# Patient Record
Sex: Male | Born: 1975 | Race: Black or African American | Hispanic: No | Marital: Single | Smoking: Never smoker
Health system: Southern US, Community
[De-identification: ages and names within clinical notes are randomized; demographics above are authoritative.]

---

## 1998-02-16 ENCOUNTER — Emergency Department (HOSPITAL_COMMUNITY): Admission: EM | Admit: 1998-02-16 | Discharge: 1998-02-16 | Payer: Self-pay | Admitting: Emergency Medicine

## 2008-10-26 ENCOUNTER — Encounter: Payer: Self-pay | Admitting: Licensed Clinical Social Worker

## 2008-10-26 ENCOUNTER — Ambulatory Visit: Payer: Self-pay | Admitting: Infectious Disease

## 2008-10-26 DIAGNOSIS — F341 Dysthymic disorder: Secondary | ICD-10-CM | POA: Insufficient documentation

## 2008-10-26 DIAGNOSIS — T7840XA Allergy, unspecified, initial encounter: Secondary | ICD-10-CM | POA: Insufficient documentation

## 2008-10-26 DIAGNOSIS — J45909 Unspecified asthma, uncomplicated: Secondary | ICD-10-CM | POA: Insufficient documentation

## 2008-10-26 DIAGNOSIS — B20 Human immunodeficiency virus [HIV] disease: Secondary | ICD-10-CM | POA: Insufficient documentation

## 2008-10-26 LAB — CONVERTED CEMR LAB
Albumin: 4.1 g/dL (ref 3.5–5.2)
BUN: 18 mg/dL (ref 6–23)
Basophils Absolute: 0.1 10*3/uL (ref 0.0–0.1)
CO2: 24 meq/L (ref 19–32)
Calcium: 9.3 mg/dL (ref 8.4–10.5)
Chlamydia, Swab/Urine, PCR: NEGATIVE
Cholesterol: 130 mg/dL (ref 0–200)
Eosinophils Relative: 3 % (ref 0–5)
GC Probe Amp, Urine: NEGATIVE
GFR calc Af Amer: >60 mL/min (ref >60)
GFR calc non Af Amer: 55 mL/min — ABNORMAL LOW (ref >60)
HDL: 24 mg/dL — ABNORMAL LOW (ref >39)
HIV-1 antibody: POSITIVE — AB
HIV-2 Ab: UNDETERMINED — AB
HIV: REACTIVE
Hemoglobin, Urine: NEGATIVE
Hep A Total Ab: NEGATIVE
LDL Cholesterol: 51 mg/dL (ref 0–99)
Leukocytes, UA: NEGATIVE
Lymphocytes Relative: 49 % — ABNORMAL HIGH (ref 12–46)
Lymphs Abs: 2.4 10*3/uL (ref 0.7–4.0)
MCHC: 34 g/dL (ref 30.0–36.0)
Monocytes Relative: 14 % — ABNORMAL HIGH (ref 3–12)
Nitrite: NEGATIVE
Protein, ur: NEGATIVE mg/dL
Sodium: 139 meq/L (ref 135–145)
Total CHOL/HDL Ratio: 5.4
Urine Glucose: NEGATIVE mg/dL
Urobilinogen, UA: 0.2 (ref 0.0–1.0)
WBC: 4.9 10*3/uL (ref 4.0–10.5)
pH: 6.5 (ref 5.0–8.0)

## 2008-10-30 ENCOUNTER — Telehealth: Payer: Self-pay

## 2008-10-30 ENCOUNTER — Encounter: Payer: Self-pay | Admitting: Infectious Disease

## 2008-11-01 ENCOUNTER — Encounter: Payer: Self-pay | Admitting: Infectious Disease

## 2008-11-13 ENCOUNTER — Ambulatory Visit: Payer: Self-pay | Admitting: Infectious Disease

## 2008-11-13 DIAGNOSIS — E785 Hyperlipidemia, unspecified: Secondary | ICD-10-CM | POA: Insufficient documentation

## 2008-11-13 LAB — CONVERTED CEMR LAB: HIV 1 RNA Quant: 801 copies/mL — ABNORMAL HIGH (ref <48)

## 2008-11-16 ENCOUNTER — Telehealth: Payer: Self-pay | Admitting: Infectious Disease

## 2008-12-11 ENCOUNTER — Ambulatory Visit: Payer: Self-pay | Admitting: Infectious Disease

## 2008-12-11 DIAGNOSIS — R5383 Other fatigue: Secondary | ICD-10-CM | POA: Insufficient documentation

## 2008-12-11 DIAGNOSIS — F0630 Mood disorder due to known physiological condition, unspecified: Secondary | ICD-10-CM | POA: Insufficient documentation

## 2008-12-11 DIAGNOSIS — R5381 Other malaise: Secondary | ICD-10-CM | POA: Insufficient documentation

## 2008-12-11 LAB — CONVERTED CEMR LAB: TSH: 1.872 microintl units/mL (ref 0.350–4.500)

## 2009-11-13 ENCOUNTER — Ambulatory Visit: Payer: Self-pay | Admitting: Infectious Disease

## 2009-11-15 ENCOUNTER — Encounter: Payer: Self-pay | Admitting: Infectious Disease

## 2009-11-26 ENCOUNTER — Ambulatory Visit: Payer: Self-pay | Admitting: Infectious Disease

## 2009-11-26 DIAGNOSIS — N189 Chronic kidney disease, unspecified: Secondary | ICD-10-CM | POA: Insufficient documentation

## 2009-11-26 DIAGNOSIS — R059 Cough, unspecified: Secondary | ICD-10-CM | POA: Insufficient documentation

## 2009-11-26 DIAGNOSIS — R05 Cough: Secondary | ICD-10-CM

## 2009-11-26 DIAGNOSIS — J050 Acute obstructive laryngitis [croup]: Secondary | ICD-10-CM | POA: Insufficient documentation

## 2009-11-26 LAB — CONVERTED CEMR LAB
Basophils Absolute: 0 10*3/uL (ref 0.0–0.1)
Basophils Relative: 1 % (ref 0–1)
Eosinophils Relative: 2 % (ref 0–5)
HCT: 47.4 % (ref 39.0–52.0)
HCV Ab: NEGATIVE
Hemoglobin: 15.8 g/dL (ref 13.0–17.0)
LDH: 147 units/L (ref 94–250)
Monocytes Relative: 9 % (ref 3–12)
Neutro Abs: 1.1 10*3/uL — ABNORMAL LOW (ref 1.7–7.7)
Nitrite: NEGATIVE
RBC: 5.35 M/uL (ref 4.22–5.81)
RDW: 13.3 % (ref 11.5–15.5)
Urobilinogen, UA: 0.2 (ref 0.0–1.0)

## 2009-12-06 ENCOUNTER — Encounter: Payer: Self-pay | Admitting: Infectious Disease

## 2009-12-10 ENCOUNTER — Ambulatory Visit: Payer: Self-pay | Admitting: Infectious Disease

## 2009-12-10 ENCOUNTER — Ambulatory Visit (HOSPITAL_COMMUNITY): Admission: RE | Admit: 2009-12-10 | Discharge: 2009-12-10 | Payer: Self-pay | Admitting: Infectious Disease

## 2010-04-30 ENCOUNTER — Telehealth: Payer: Self-pay

## 2010-05-01 ENCOUNTER — Telehealth: Payer: Self-pay

## 2010-05-06 ENCOUNTER — Encounter: Payer: Self-pay | Admitting: Infectious Disease

## 2010-05-07 ENCOUNTER — Encounter: Payer: Self-pay | Admitting: Infectious Disease

## 2010-08-11 ENCOUNTER — Encounter: Payer: Self-pay | Admitting: Infectious Disease

## 2010-08-18 LAB — CONVERTED CEMR LAB
ALT: 18 units/L (ref 0–53)
BUN: 19 mg/dL (ref 6–23)
Calcium: 9.5 mg/dL (ref 8.4–10.5)
Creatinine, Ser: 1.53 mg/dL — ABNORMAL HIGH (ref 0.40–1.50)
Eosinophils Absolute: 0.1 10*3/uL (ref 0.0–0.7)
GC Probe Amp, Urine: NEGATIVE
HCT: 44.8 % (ref 39.0–52.0)
Hemoglobin, Urine: NEGATIVE
Hemoglobin: 15.5 g/dL (ref 13.0–17.0)
Ketones, ur: NEGATIVE mg/dL
Lymphocytes Relative: 48 % — ABNORMAL HIGH (ref 12–46)
Monocytes Relative: 12 % (ref 3–12)
Neutro Abs: 1.5 10*3/uL — ABNORMAL LOW (ref 1.7–7.7)
Neutrophils Relative %: 38 % — ABNORMAL LOW (ref 43–77)
Platelets: 310 10*3/uL (ref 150–400)
Potassium: 4.4 meq/L (ref 3.5–5.3)
Protein, ur: NEGATIVE mg/dL
RBC: 5.13 M/uL (ref 4.22–5.81)
RDW: 13.2 % (ref 11.5–15.5)
Sodium: 136 meq/L (ref 135–145)
Specific Gravity, Urine: 1.029 (ref 1.005–1.030)
Total Bilirubin: 0.5 mg/dL (ref 0.3–1.2)
Urine Glucose: NEGATIVE mg/dL
Urobilinogen, UA: 0.2 (ref 0.0–1.0)

## 2010-08-20 NOTE — Progress Notes (Signed)
Summary: Having surgery .  Phone Note Call from Patient   Summary of Call: Pt called he is having his tonsils removed and wonders if he should inform the ENT physician that he is HIV positive. Pt informed he must inform the physician and should have done this upon initiation   of treatment. If labs or records are needed they can contact our office.  Tomasita Morrow RN  May 01, 2010 11:20 AM  Initial call taken by: Tomasita Morrow RN,  May 01, 2010 11:20 AM

## 2010-08-20 NOTE — Letter (Signed)
Summary: F[employer]A: [employer]  FMLA: [employer]   Imported By: Florinda Marker 11/29/2009 11:18:43  _____________________________________________________________________  External Attachment:    Type:   Image     Comment:   External Document

## 2010-08-20 NOTE — Medication Information (Signed)
Summary: [employer]:RX  [employer]:RX   Imported By: Florinda Marker 05/14/2010 11:46:22  _____________________________________________________________________  External Attachment:    Type:   Image     Comment:   External Document

## 2010-08-20 NOTE — Assessment & Plan Note (Signed)
Summary: fukam   Visit Type:  Follow-up Primary Provider:  [name]  CC:  f/u congestion.  History of Present Illness: 35 year old was tested for HIV positive in January 2010.His cd4 cound in the 300s, and low viral load. He  has failed to followup with me for more than a year and his cd4 is now down to 290. I spent more than an hour with this patient with more than 30 minutes spent  face to face counselling him. Mr. Lezotte has been suffering from severe depression made more complicated by the loss of his grandmother to whom he was especially close. He has just met a new potential lover and is having great difficulty revealign to him him that he is HIV positive. He denies even having kissed this partner. I emphasized the need for him to disclose his status and to seek that of this potential friend. He continues to work but is severely depressed stil though he denies passive or active suicidal ideation. He was offered psychaitry counselling and social work referral. He does feel that depression has improved since starting the prozac. He statest taht he felt even better with provigil but I told him I was quite reluctant to prescribet that contorlled substance for him without psychiatry evluating him and their feeling certain that he needed it. Second the patient endorsed recent sinus congestion and nonprodcutive cough that has concerned him for possible bronchitis. We spent more than 30 minutes specifically discussing antiviral therapy choices for him within the context of his elevated creatinine at 1.53 on recent lab tests including possibility of epzicom plus sustive vs PI vs raltegriavr vs combivir plus sustiva vs PI vs raltegravir vs less conventional choice.  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 Take 1 Tablet By Mouth Once A Day  Current Medications (verified): 1)  Singulair 10 Mg Tabs (Montelukast Sodium) 2)  Fluoxetine Hcl 20 Mg Tabs (Fluoxetine Hcl) .... Take 1 Tablet By Mouth Once A Day 3)  Zithromax Z-Pak 250 Mg Tabs (Azithromycin) .... Take As Directed 4)  Proventil Hfa 108 (90 Base) Mcg/act Aers (Albuterol Sulfate) .... Two Puffs Four Times A Day  Allergies (verified): No Known Drug Allergies    Preventive Screening-Counseling & Management  Alcohol-Tobacco     Alcohol drinks/day: 0     Smoking Status: never  Caffeine-Diet-Exercise     Caffeine use/day: o     Does Patient Exercise: yes     Type of exercise: aerobic     Exercise (avg: min/session): 1 hour      Times/week: 3   Current Allergies (reviewed today): No known allergies  Past History:  Past Medical History: Last updated: 11/13/2008 Ashtma Anxiety Depression HIV diagnosed in [place] of 2010  Family History: Last updated: 11/13/2008 early coronary artery disease, esrd  Social History: Last updated: 11/13/2008 no smoking, occ etoh  Risk Factors: Alcohol Use: 0 (11/26/2009) Caffeine Use: o (11/26/2009) Exercise: yes (11/26/2009)  Risk Factors: Smoking Status: never (11/26/2009)  Family  History: Reviewed history from 11/13/2008 and no changes required. early coronary artery disease, esrd  Social History: Reviewed history from 11/13/2008 and no changes required. no smoking, occ etoh  Review of Systems       The patient complains of prolonged cough and depression.  The patient denies anorexia, fever, weight loss, weight gain, vision loss, decreased hearing,  hoarseness, chest pain, syncope, dyspnea on exertion, peripheral edema, headaches, hemoptysis, abdominal pain, melena, hematochezia, severe indigestion/heartburn, hematuria, incontinence, genital sores, muscle weakness, suspicious skin lesions, transient blindness, difficulty walking, unusual weight change, abnormal bleeding, and enlarged lymph nodes.    Vital Signs:  Patient profile:   35 year old male Height:      68 inches (172.72 cm) Weight:      154.75 pounds (70.34 kg) BMI:     23.61 Temp:     97.7 degrees F (36.50 degrees C) oral Pulse rate:   71 / minute BP sitting:   141 / 92  (left arm)  Vitals Entered By: Starleen Arms CMA (Nov 26, 2009 11:07 AM) CC: f/u congestion Is Patient Diabetic? No Pain Assessment Patient in pain? no      Nutritional Status BMI of 19 -24 = normal Nutritional Status Detail nl  Does patient need assistance? Functional Status Self care Ambulation Normal   Physical Exam  General:  alert and well-developed.  well-nourished and well-hydrated.   Head:  normocephalic.  no sinus tenderness Eyes:  vision grossly intact, pupils equal, pupils round, and pupils reactive to light.   Ears:  no external deformities.   Nose:  no external deformity.   Mouth:  no erythema, no exudates, no posterior lymphoid hypertrophy, and no postnasal drip.   Neck:  supple and full ROM.   Lungs:  normal respiratory effort, no crackles, and no wheezes.   Heart:  normal rate, regular rhythm, no murmur, and no gallop.   Abdomen:  no distention.  soft and normal bowel sounds.   Msk:  normal ROM.  no joint deformities.   Extremities:  No clubbing, cyanosis, edema, or deformity noted with normal full range of motion of all joints.   Neurologic:  alert & oriented X3.  strength normal in all extremities and gait normal.   Skin:  color normal, no rashes, and no suspicious lesions.   Psych:  Oriented X3.  depressed affect, subdued, and poor eye contact.               Prevention For Positives: 11/26/2009   Safe sex practices discussed with patient. Condoms offered.   Education Materials Provided: 11/26/2009 Safe sex practices discussed with patient. Condoms offered.                          Impression & Recommendations:  Problem # 1:  HIV INFECTION (ICD-042) Assessment Deteriorated Given his elevated creatinine I am not anxious to put him on truvada . I will check his HLAB5701. If [id number] neg, options for him would include epzicom, sustiva (though their is some risk given his depression with this drug I think it wiould be good choice), vs epziocm plus PI regimen, or twice daily raltegravir. IF [id number] negative combivir plus above options vs less conventionall approach of NNRTI plus raltegravir plus one NRTI such as epivir might be an optoin. If he does not start therapy soon he will need meds for PCP prophylaxis Orders: T-HLA-B*5701 ([id number]) T-Urine 24 hr. IFE ([id number]) T-Urine Microalbumin w/creat. ratio [phone]) T-Urinalysis (81003-65000) Est.  Patient Level V [phone])  His updated medication list for this problem includes:    Zithromax Z-pak 250 Mg Tabs (Azithromycin) .Marland Kitchen... Take as directed  Problem # 2:  COUGH (UEA-540.9) Assessment: New May have a bit of bronchitis, and may have component of his asthma driving this. Will give him zpack and beta agonist.. I will check LDH though I dont think he has PCP pneumoinia. His updated medication list for this problem includes:    Singulair 10 Mg Tabs (Montelukast sodium)    Zithromax Z-pak 250 Mg Tabs (Azithromycin) .Marland Kitchen... Take as directed    Proventil Hfa 108 (90 Base) Mcg/act Aers (Albuterol sulfate) .Marland Kitchen..Marland Kitchen Two puffs four times a day  Orders: T-CBC w/Diff ([id number]) T-Lactate Dehydrogenase (LDH) (56213-08657) Est. Patient Level V (84696)  Problem # 3:  CHRONIC KIDNEY DISEASE UNSPECIFIED (ICD-585.9) Creatinine has been up to 1.48 and 1.53 on recent check.  WIll check spot ua microalbumin to cretainine, hep c ab, 24 hr urine renal ultrasound. May need neprhology referra if  no obvious cause. He has several family  members with HTN and kidney disease though he himself is normotiensive Orders: T-Hepatitis C Antibody ([id number]) Ultrasound (Ultrasound) Est. 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 I would really like for him to be seen by psychiatry and get cognitive behavioral therapy and more optimal selection of pharmacotherapeutics provided they do not interfer with his ARVs.  Orders: Est. Patient Level V (72536)  Problem # 5:  ASTHMA (ICD-493.90)  likely playing a role in his cough. Beta agonist given. He may need inhaled corticosteroid. His updated medication list for this problem includes:    Singulair 10 Mg Tabs (Montelukast sodium)    Proventil Hfa 108 (90 Base) Mcg/act Aers (Albuterol sulfate) .Marland Kitchen..Marland Kitchen Two puffs four times a day  Orders: Est. Patient Level V (64403)  Medications Added to Medication List This Visit: 1)  Zithromax Z-pak 250 Mg Tabs (Azithromycin) .... Take as directed 2)  Proventil Hfa 108 (90 Base) Mcg/act Aers (Albuterol sulfate) .... Two puffs four times a day  Other Orders: Psychiatric Referral (Psych) Hepatitis A Vaccine (Adult Dose) (47425) Admin 1st Vaccine (95638) Hepatitis B Vaccine >19yrs [phone]) Admin of Any Addtl Vaccine (32951)  Patient Instructions: 1)  we will get blood work today 2)  We will bring you back to clinic on 5/19 to see Dr. Daiva Eves in am    Immunizations Administered:  Hepatitis A Vaccine # 2:    Vaccine Type: HepA    Site: left deltoid    Mfr: Merck    Dose: 0.5 ml    Route: IM    Given by: Starleen Arms CMA    Exp. Date: 11/07/2011    Lot #: [id number]    VIS given: 10/08/04 version given Nov 26, 2009.  Hepatitis B Vaccine # 2:    Vaccine Type: HepB Adult    Site: left  deltoid    Mfr: Merck    Dose: 0.5 ml    Route: IM    Given by: Starleen Arms CMA    Exp. Date: 10/18/2011    Lot #: [id number]    VIS given: 02/04/06 version given Nov 26, 2009. Prescriptions: PROVENTIL HFA 108 (90 BASE) MCG/ACT AERS (ALBUTEROL SULFATE) two puffs four times a day  #1 x 11   Entered and Authorized by:   Acey Lav MD   Signed by:   Paulette Blanch  Dam MD on 11/26/2009   Method used:   Print then Give to Patient   RxID:   [id number] ZITHROMAX Z-PAK 250 MG TABS (AZITHROMYCIN) take as directed  #5 x 0   Entered and Authorized by:   Acey Lav MD   Signed by:   [name] on 11/26/2009   Method used:   Print then Give to Patient   RxID:   [id number]

## 2010-08-20 NOTE — Letter (Signed)
Summary: [employer]: FMLA   [employer]: FMLA   Imported By: Florinda Marker 12/11/2009 13:53:30  _____________________________________________________________________  External Attachment:    Type:   Image     Comment:   External Document

## 2010-08-20 NOTE — Medication Information (Signed)
Summary: [employer]: Rx  [employer]: Rx   Imported By: Florinda Marker 05/13/2010 15:51:09  _____________________________________________________________________  External Attachment:    Type:   Image     Comment:   External Document

## 2010-08-20 NOTE — Miscellaneous (Signed)
Summary: Lab orders  Clinical Lists Changes  Orders: Added new Test order of T-CBC w/Diff [phone]) - Signed Added new Test order of T-CD4SP [facility]) (CD4SP) - Signed Added new Test order of T-Chlamydia  Probe, urine [phone]) - Signed Added new Test order of T-GC Probe, urine [phone]) - Signed Added new Test order of T-Comprehensive Metabolic Panel [phone]) - Signed Added new Test order of T-HIV Viral Load [phone]) - Signed Added new Test order of T-Urinalysis ([id number]) - Signed Added new Test order of T-RPR (Syphilis) ([id number]) - Signed

## 2010-08-20 NOTE — Assessment & Plan Note (Signed)
Summary: F/U OV/PER DR VAN DAM/VS   Visit Type:  Follow-up Primary Provider:  [name]  CC:  follow-up visit.  History of Present Illness: 35 year old with HIV with slightly elevated creatinine to 1.53 returns for visit to day to discuss ARV options. He tested negative for HLAB5701 and had no R mutations. We discussed several treatment options including epzicom, anda  boosted PI, epzicom and raltegravir and finally settled on sustiva and epzicom. He really wished to be on the simplest regimen with the least side effects and least liklihood of discovery of his medications by his mother. We discussed his borderline elevated creatiinin and his hypoechooic kidneys on renal UA. We discusssed his obtaining a 24 hour urine in [place].  We spent an hour on this visit including 30 minutes discussing ARVs as well as the meaning of the creatiine.  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 Take 1 Tablet By Mouth Once A Day 3)  Zithromax Z-Pak 250 Mg Tabs (Azithromycin) .... Take As Directed 4)  Proventil Hfa 108 (90 Base) Mcg/act Aers (Albuterol Sulfate) .... Two Puffs Four Times A Day 5)  Sustiva 600 Mg Tabs (Efavirenz) .... Take 1 Tablet By Mouth Once A Day 6)  Epzicom 600-300 Mg Tabs (Abacavir Sulfate-Lamivudine) .... Take 1 Tablet By Mouth Once A Day  Allergies (verified): No Known Drug  Allergies   Preventive Screening-Counseling & Management  Alcohol-Tobacco     Alcohol drinks/day: 0     Smoking Status: never  Caffeine-Diet-Exercise     Caffeine use/day: 0     Does Patient Exercise: yes     Type of exercise: aerobic     Exercise (avg: min/session): 1 hour      Times/week: 3  Safety-Violence-Falls     Seat Belt Use: yes   Current Allergies (reviewed today): No known allergies  Past History:  Past Medical History: Ashtma Anxiety Depression HIV diagnosed in [place] of 2010 Chronic kidney disease cr up to 1.53 uclear cause  Past Surgical History: None  Review of Systems  The patient denies anorexia, fever, weight loss, weight gain, vision loss, decreased hearing, hoarseness, chest pain, syncope, dyspnea on exertion, peripheral edema, prolonged cough, headaches, hemoptysis, abdominal pain, melena, hematochezia, severe indigestion/heartburn, hematuria, incontinence, genital sores, muscle weakness, suspicious skin lesions, transient blindness, difficulty walking, depression, unusual weight change, abnormal bleeding, and enlarged lymph nodes.    Vital Signs:  Patient profile:   36 year old male Height:      68 inches (172.72 cm) Weight:      158.8 pounds (72.18 kg) BMI:     24.23 Temp:     97.7 degrees F (36.50 degrees C) oral Pulse rate:   67 / minute BP sitting:   144 / 81  (right arm)  Vitals Entered By: Baxter Hire) (Dec 10, 2009 8:55 AM) CC: follow-up visit Pain Assessment Patient in pain? no  Nutritional Status BMI of 19 -24 = normal Nutritional Status Detail appetite is decreasing per patient  Does patient need assistance? Functional Status Self care Ambulation Normal   Physical Exam  General:  alert and well-developed.  well-nourished and well-hydrated.   Head:  normocephalic.  no sinus tenderness Eyes:  vision grossly intact, pupils equal, pupils round, and pupils reactive to light.   Ears:  no external deformities.     Nose:  no external deformity.   Mouth:  no erythema, no exudates, no posterior lymphoid hypertrophy, and no postnasal drip.   Neck:  supple and full ROM.   Lungs:  normal respiratory effort, no crackles, and no wheezes.   Heart:  normal rate, regular rhythm, no murmur, and no gallop.   Abdomen:  no distention.  soft and normal bowel sounds.   Msk:  normal ROM.  no joint deformities.   Extremities:  No clubbing, cyanosis, edema, or deformity noted with normal full range of motion of all joints.   Neurologic:  alert & oriented X3.  strength normal in all extremities and gait normal.   Skin:  color normal, no rashes, and no suspicious lesions.   Psych:  Oriented X3.  improved mood    Impression & Recommendations:  Problem # 1:  HIV INFECTION (ICD-042) We will start sustiva and epzicom His updated medication list for this problem includes:    Zithromax Z-pak 250 Mg Tabs (Azithromycin) .Marland Kitchen... Take as directed  Orders: T-Lipid Profile ([id number]) Est. Patient Level V (99215)Future Orders: T-CD4SP ([facility]) (CD4SP) ... 01/28/2010 T-HIV Viral Load [id number]) ... 01/28/2010 T-Comprehensive Metabolic Panel [id number]) ... 01/28/2010 T-CBC w/Diff ([id number]) ... 01/28/2010  Diagnostics Reviewed:  HIV: CDC-defined AIDS (11/26/2009)   HIV-Western blot: Positive (10/26/2008)   CD4: 290 (11/14/2009)   WBC: 3.3 (11/26/2009)   Hgb: 15.8 (11/26/2009)   HCT: 47.4 (11/26/2009)   Platelets: 304 (11/26/2009) HIV genotype: See Comment (11/15/2009)   HIV-1 RNA: 1150 (11/13/2009)   HBSAg: NEG (10/26/2008)  Problem # 2:  CHRONIC KIDNEY DISEASE UNSPECIFIED (ICD-585.9)  colllect 24 hour urine. Will consider Renal consult. He did not have obvious protein in the UA. and is not an obvious HIV nephropathy microalbumin and creatinine were not done last visit. BP up today but he has not been consistently HTNsive.  Orders: Est. Patient Level V (28413)  Problem # 3:  MOOD DISORDER IN CONDITIONS  CLASSIFIED ELSEWHERE (ICD-293.83)  impvroved  Orders: Est. Patient Level V (24401)  Problem # 4:  COUGH (ICD-786.2)  improved His updated medication list for this problem includes:    Singulair 10 Mg Tabs (Montelukast sodium)    Zithromax Z-pak 250 Mg Tabs (Azithromycin) .Marland Kitchen... Take as directed    Proventil Hfa 108 (90 Base) Mcg/act Aers (Albuterol sulfate) .Marland Kitchen..Marland Kitchen Two puffs four times a day  Orders: Est. Patient Level V (02725)  Medications Added to Medication List This Visit: 1)  Sustiva 600 Mg Tabs (Efavirenz) .... Take 1 tablet by mouth once a day 2)  Epzicom 600-300 Mg Tabs (Abacavir sulfate-lamivudine) .... Take 1 tablet by mouth once a day  Patient Instructions: 1)  rtc in [place] july   Prescriptions: EPZICOM 600-300 MG TABS (ABACAVIR SULFATE-LAMIVUDINE) Take 1 tablet by mouth once a day  #90 x 4   Entered and Authorized by:   Acey Lav MD   Signed by:   [name] on 12/10/2009   Method used:   Print then Give to Patient   RxID:   [id number] SUSTIVA 600  MG TABS (EFAVIRENZ) Take 1 tablet by mouth once a day  #90 x 4   Entered and Authorized by:   Acey Lav MD   Signed by:   [name] on 12/10/2009   Method used:   Print then Give to Patient   RxID:   [id number] EPZICOM 600-300 MG TABS (ABACAVIR SULFATE-LAMIVUDINE) Take 1 tablet by mouth once a day  #30 x 11   Entered and Authorized by:   Acey Lav MD   Signed by:   [name] on 12/10/2009   Method used:   Print then Give to Patient   RxID:   [id number] SUSTIVA 600 MG TABS (EFAVIRENZ) Take 1 tablet by mouth once a day  #30 x 11   Entered and Authorized by:   Acey Lav MD   Signed by:   [name] on 12/10/2009   Method used:   Print then Give to Patient   RxID:   [id number]

## 2010-08-20 NOTE — Progress Notes (Signed)
Summary: [employer] calling for information  Phone Note Call from Patient   Caller: [employer] Summary of Call: Reference number  :   [phone]   [facility] requesting date of if any HLA testing was done.   Pt has script for Epzicom.  Test performed .Date given. Tomasita Morrow RN  April 30, 2010 3:44 PM  Initial call taken by: Tomasita Morrow RN,  April 30, 2010 3:44 PM     Appended Document: [employer] calling for information [phone] [employer]  Appended Document: [employer] calling for information On Nov 26, 2009 I tested his HLA B5701 and it was negative. I made sure of this before starting him on epzicom on which he has been for 5 months without problems

## 2010-09-16 ENCOUNTER — Telehealth (INDEPENDENT_AMBULATORY_CARE_PROVIDER_SITE_OTHER): Payer: Self-pay | Admitting: *Deleted

## 2010-09-26 NOTE — Progress Notes (Addendum)
Summary: Phone call  Phone Note Call from Patient   Caller: Patient Call For: [name] Details for Reason: Pt wanted to know when his last pneumonia shot was. Summary of Call: Pt called asking if we knew when his last pneumonia shot was. Advised him it was in 2010 and he was not due to have another until 2015. He also said he had the Flu shot at [facility] on [address]. I also told him that he had not been in clinic since 5/11 and needed to be seen asap. The patient agreed and I transfered him to the front desk to make a lab appt and 2wk follow-up.  Alesia Morin CMA  September 16, 2010 10:56 AM Initial call taken by: Alesia Morin CMA,  September 16, 2010 10:56 AM     Appended Document: Phone call Matthew Malone can we find out about Matthew Malone flu status and vaccinate him if possible. If he comes in April this will be too late for this flu season

## 2010-10-29 LAB — T-HELPER CELL (CD4) - (RCID CLINIC ONLY)
CD4 % Helper T Cell: 17 % — ABNORMAL LOW (ref 33–55)
CD4 T Cell Abs: 330 uL — ABNORMAL LOW (ref 400–2700)

## 2010-10-30 LAB — T-HELPER CELL (CD4) - (RCID CLINIC ONLY)
CD4 % Helper T Cell: 15 % — ABNORMAL LOW (ref 33–55)
CD4 T Cell Abs: 370 uL — ABNORMAL LOW (ref 400–2700)

## 2010-11-04 ENCOUNTER — Other Ambulatory Visit: Payer: Self-pay

## 2010-11-18 ENCOUNTER — Ambulatory Visit: Payer: Self-pay | Admitting: Infectious Disease

## 2010-11-21 ENCOUNTER — Ambulatory Visit: Payer: Self-pay | Admitting: Infectious Disease

## 2010-12-06 ENCOUNTER — Telehealth: Payer: Self-pay | Admitting: Infectious Disease

## 2010-12-06 NOTE — Telephone Encounter (Signed)
THIS IS TO DOCUMENT THAT MR Matthew Malone HAD HIS INFLUENZA VACCINE ADMINISTERED ON April 23, 2010 AT [facility] ON [address].

## 2011-04-02 IMAGING — US US RENAL
1 series · 14 of 20 positions shown · non-contrast
Comparison: None.

CLINICAL DATA: Decreased renal function.

RENAL/URINARY TRACT ULTRASOUND COMPLETE

[Series 1: us renal · 0.31mm/px · 14 of 20 slices shown]
[im 1/20]
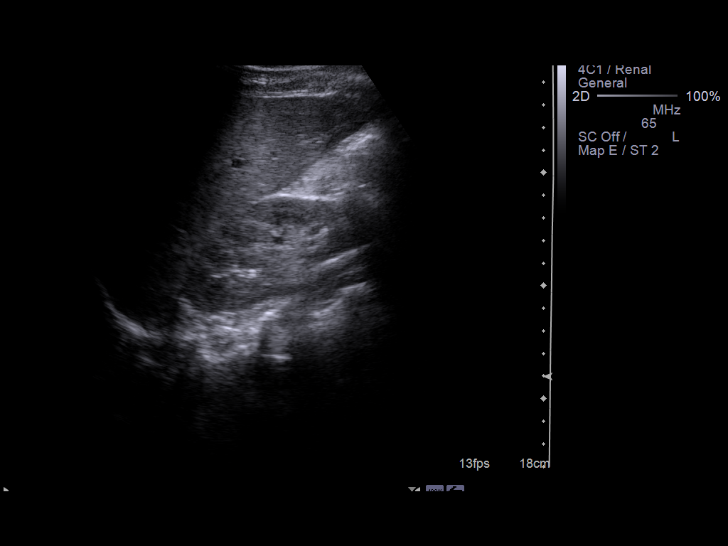
[im 3/20]
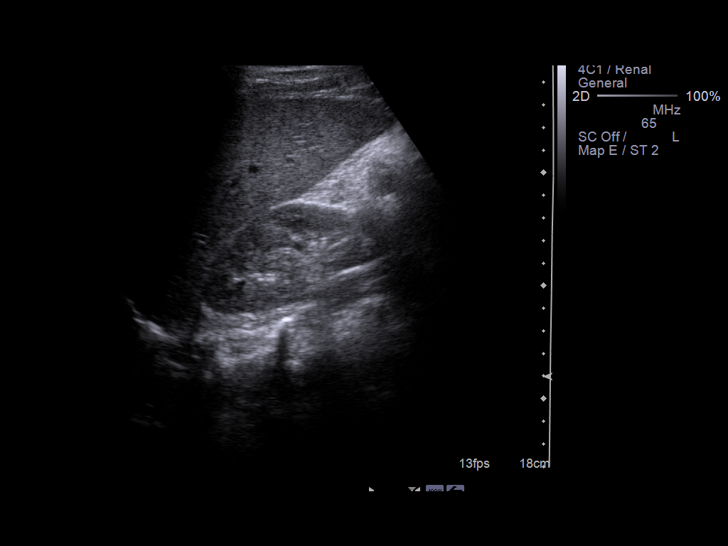
[im 4/20]
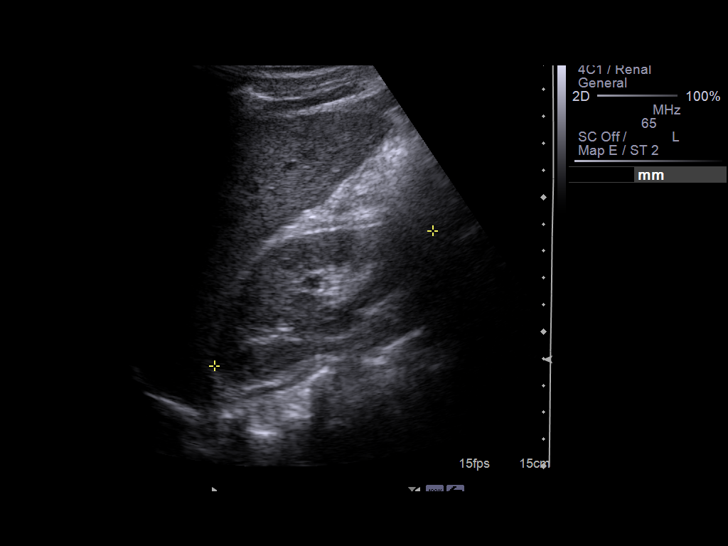
[im 6/20]
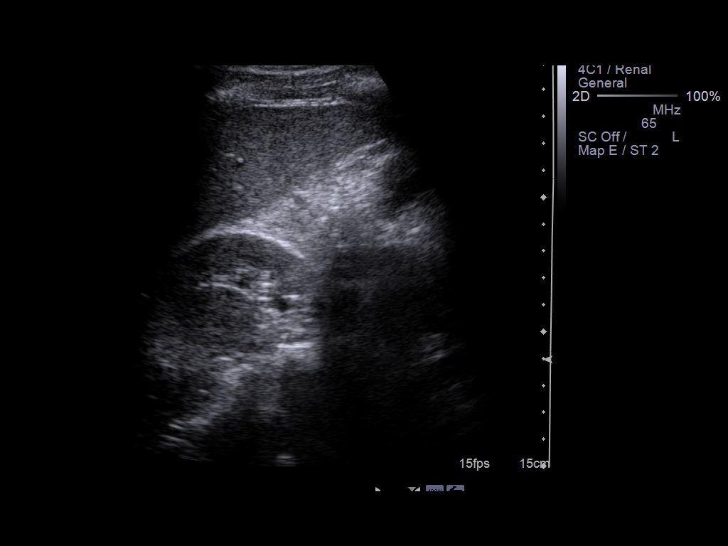
[im 7/20]
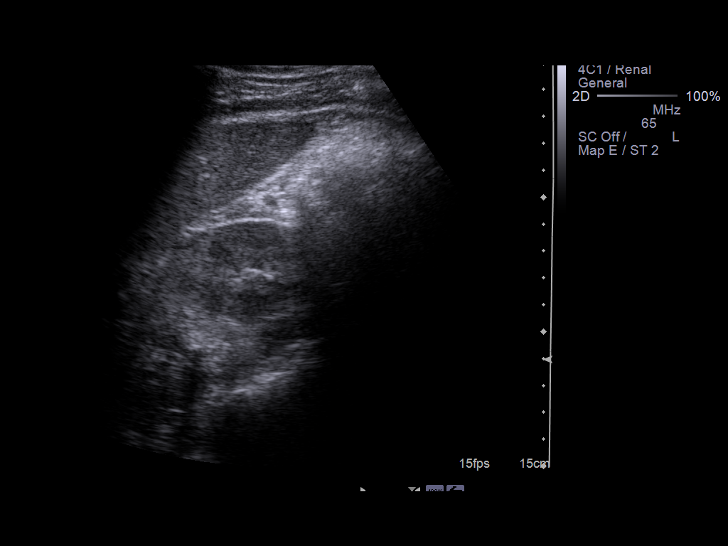
[im 8/20]
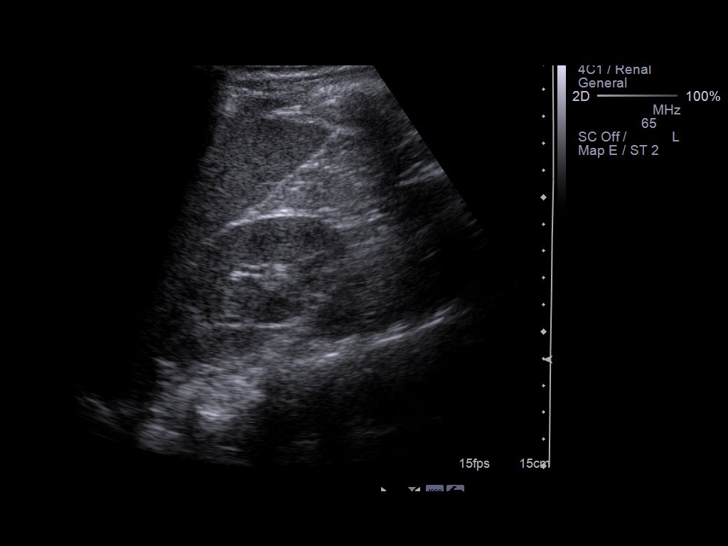
[im 10/20]
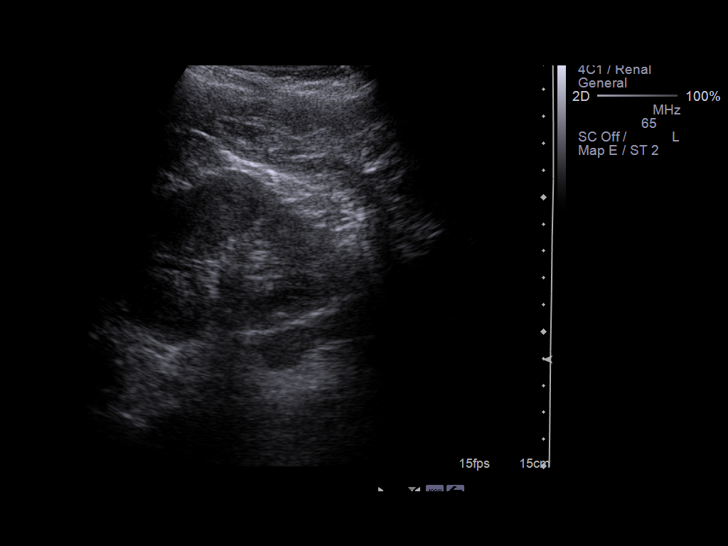
[im 11/20]
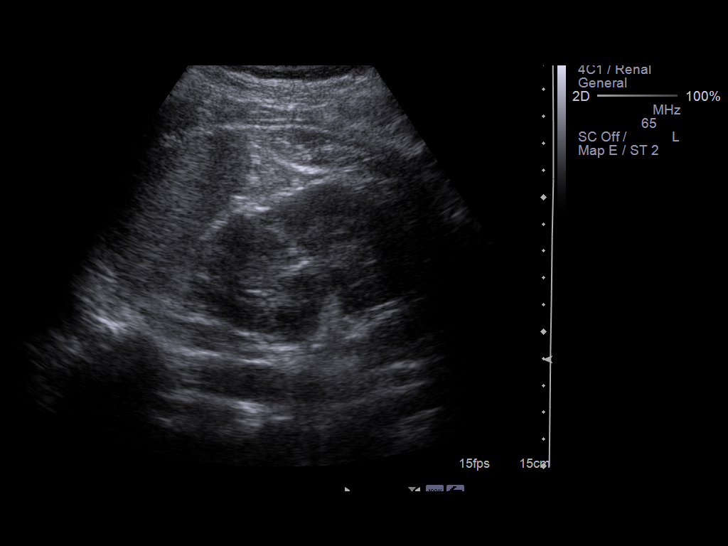
[im 13/20]
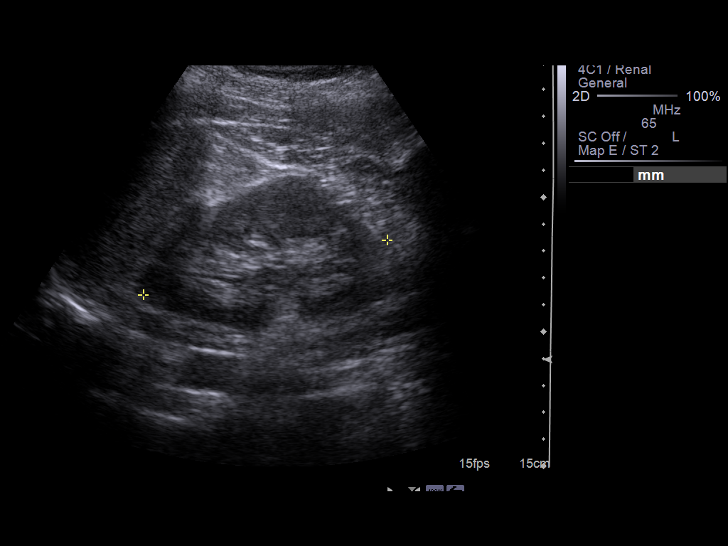
[im 14/20]
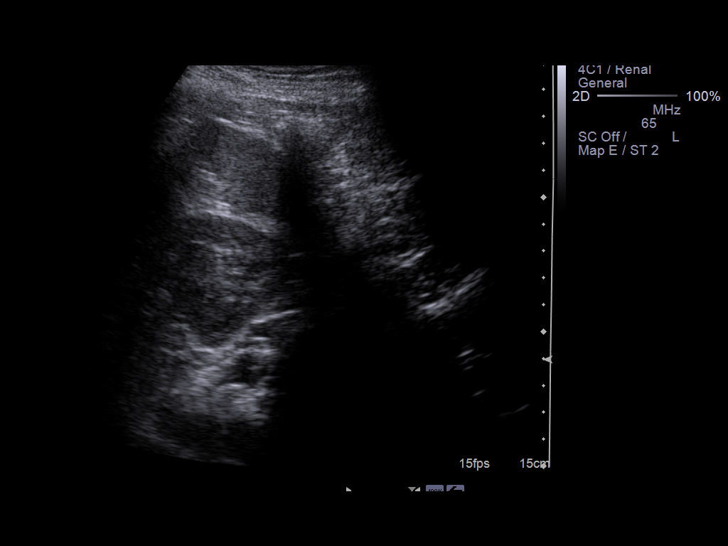
[im 16/20]
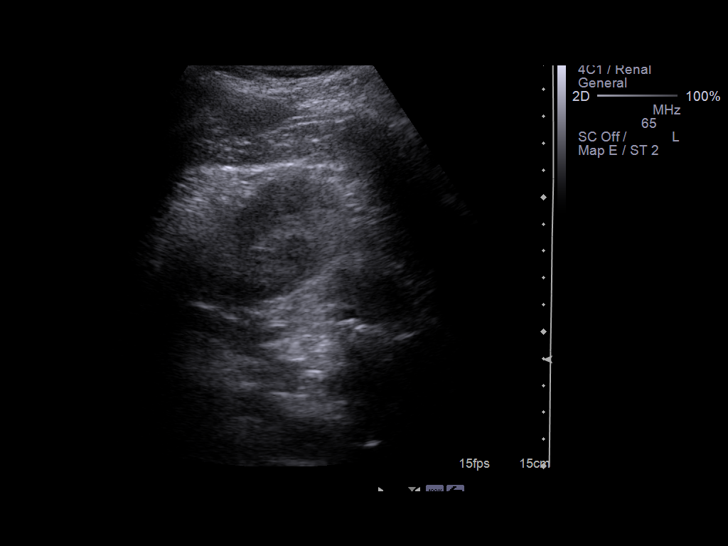
[im 17/20]
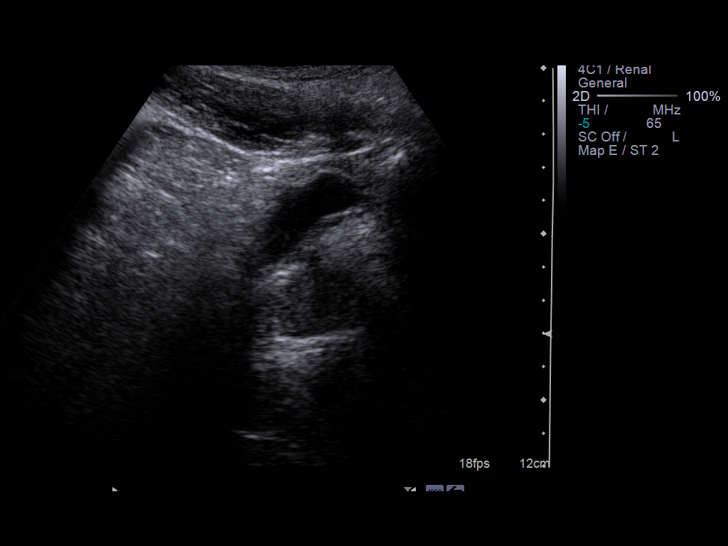
[im 18/20]
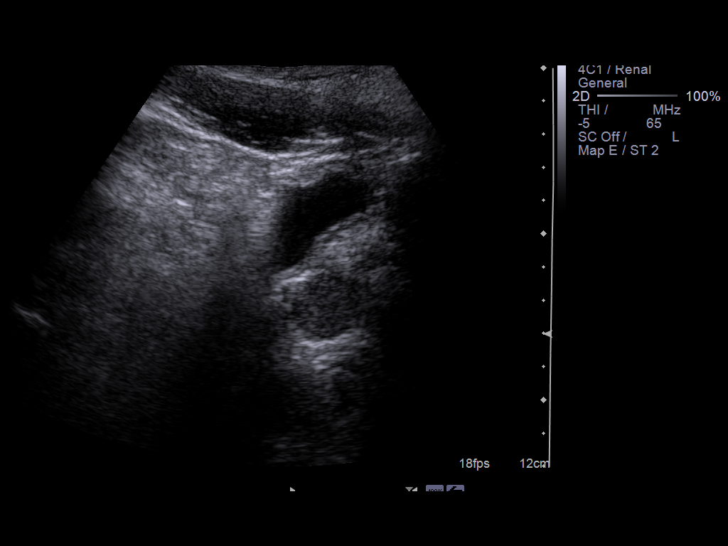
[im 20/20]
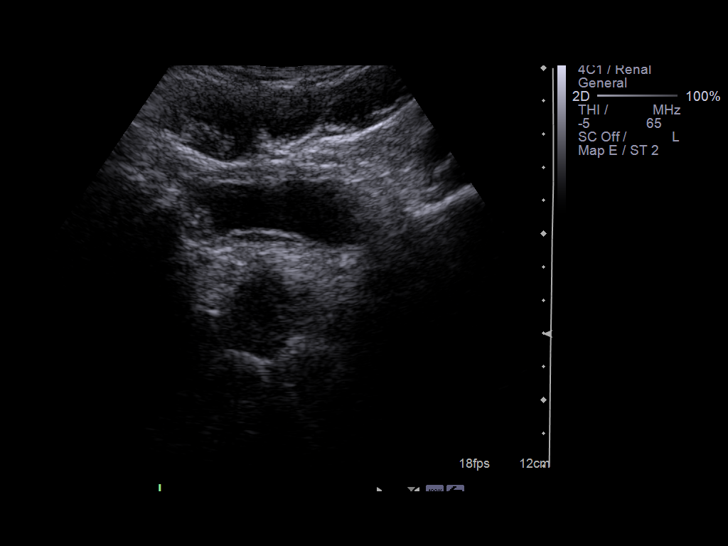

[14 of 20 positions shown; findings below may reference images not displayed]

FINDINGS: Right Kidney:  Increased parenchymal echogenicity is noted.  No
focal mass lesion is present.  There is no hydronephrosis.  The
maximal length is 9.5 cm.

Left Kidney:  Increased parenchymal echogenicity is noted.  No
focal mass lesion is present.  There is no hydronephrosis.  The
maximal length is 9.3 cm.

Bladder:  The urinary bladder is normal and mostly collapsed.
IMPRESSION: 1.  Small hyperechoic kidneys bilaterally.  This is nonspecific,
but can be seen in the setting of medical renal disease.
2.  No hydronephrosis.

## 2014-01-10 ENCOUNTER — Encounter (HOSPITAL_COMMUNITY): Payer: Self-pay | Admitting: Emergency Medicine

## 2014-01-10 ENCOUNTER — Emergency Department (HOSPITAL_COMMUNITY)
Admission: EM | Admit: 2014-01-10 | Discharge: 2014-01-10 | Disposition: A | Discharge status: Home / Self Care | Payer: Self-pay | Attending: Emergency Medicine | Admitting: Emergency Medicine

## 2014-01-10 DIAGNOSIS — K610 Anal abscess: Secondary | ICD-10-CM

## 2014-01-10 DIAGNOSIS — R6883 Chills (without fever): Secondary | ICD-10-CM | POA: Insufficient documentation

## 2014-01-10 DIAGNOSIS — B20 Human immunodeficiency virus [HIV] disease: Secondary | ICD-10-CM

## 2014-01-10 DIAGNOSIS — R61 Generalized hyperhidrosis: Secondary | ICD-10-CM | POA: Insufficient documentation

## 2014-01-10 DIAGNOSIS — K612 Anorectal abscess: Secondary | ICD-10-CM | POA: Insufficient documentation

## 2014-01-10 DIAGNOSIS — Z21 Asymptomatic human immunodeficiency virus [HIV] infection status: Secondary | ICD-10-CM | POA: Insufficient documentation

## 2014-01-10 DIAGNOSIS — Z79899 Other long term (current) drug therapy: Secondary | ICD-10-CM | POA: Insufficient documentation

## 2014-01-10 MED ORDER — SULFAMETHOXAZOLE-TRIMETHOPRIM 800-160 MG PO TABS: 1.0000 | ORAL_TABLET | Freq: Two times a day (BID) | ORAL | Status: AC

## 2014-01-10 NOTE — Discharge Instructions (Signed)

## 2014-01-10 NOTE — ED Provider Notes (Signed)
Medical screening examination/treatment/procedure(s) were conducted as a shared visit with non-physician practitioner(s) and myself.  I personally evaluated the patient during the encounter.   Pt c/o left buttock abscess, hx same.  No fever or chills. +fluctuant 3-4 cm abscess.   I and D per PA.   Suzi RootsKevin E Steinl, MD 01/10/14 [phone]

## 2014-01-10 NOTE — ED Notes (Signed)
ED PA at bedside for I&D of abscess ?

## 2014-01-10 NOTE — ED Notes (Signed)
Initial Contact - pt rec'd from [facility] at this time with c/o abscess near his rectum x3 days.  Pt reports worsening of pain and swelling to the area.  Pt reports recent sinus infection and reports subjective fevers.  Pt denies drainage from site.  Pt reports hx of similar abscess last year which was I&D'd by his HIV MD.  A+Ox4, skin PWD.  MAEI.  Sitting comfortably on stretcher.  NAD.

## 2014-01-10 NOTE — ED Provider Notes (Signed)
CSN: [id number]     Arrival date & time 01/10/14  1302 History   First MD Initiated Contact with Patient 01/10/14 1518     Chief Complaint  Patient presents with  . Abscess   Patient is a [age] y.o. male presenting with abscess. The history is provided by the patient. No language interpreter was used.  Abscess Location:  Ano-genital Ano-genital abscess location:  L buttock Abscess quality: fluctuance, induration, painful and warmth   Abscess quality: not draining, no redness and not weeping   Red streaking: no   Duration:  3 days Progression:  Worsening Pain details:    Quality:  Throbbing and pressure   Severity:  Severe   Duration:  3 days   Timing:  Constant   Progression:  Worsening Chronicity:  New Context: immunosuppression   Context: not diabetes, not injected drug use, not insect bite/sting and not skin injury   Relieved by:  Warm compresses and warm water soaks Worsened by:  Nothing tried Ineffective treatments:  None tried Associated symptoms: no anorexia, no fatigue, no fever, no headaches, no nausea and no vomiting   Risk factors: prior abscess   Risk factors: no hx of MRSA     History reviewed. No pertinent past medical history. History reviewed. No pertinent past surgical history. History reviewed. No pertinent family history. History  Substance Use Topics  . Smoking status: Never Smoker   . Smokeless tobacco: Not on file  . Alcohol Use: No    Review of Systems  Constitutional: Positive for chills and diaphoresis. Negative for fever and fatigue.       Night sweats  Cardiovascular: Negative for chest pain, palpitations and leg swelling.  Gastrointestinal: Negative for nausea, vomiting, abdominal pain, diarrhea, constipation, blood in stool, abdominal distention, rectal pain and anorexia.  Genitourinary: Negative for discharge, penile swelling and penile pain.  Musculoskeletal: Negative for back pain.  Skin: Negative for color change, pallor, rash and wound.        Perirectal abscess.  Neurological: Negative for headaches.  All other systems reviewed and are negative.     Allergies  Review of patient's allergies indicates no known allergies.  Home Medications   Prior to Admission medications   Medication Sig Start Date End Date Taking? Authorizing Provider  cetirizine (ZYRTEC) 10 MG tablet Take 10 mg by mouth daily as needed for allergies.   Yes Historical Provider, MD  elvitegravir-cobicistat-emtricitabine-tenofovir (STRIBILD) 150-150-200-300 MG TABS tablet Take 1 tablet by mouth daily with breakfast.   Yes Historical Provider, MD  valACYclovir (VALTREX) 500 MG tablet Take 500 mg by mouth daily as needed (outbreak).   Yes Historical Provider, MD  sulfamethoxazole-trimethoprim (SEPTRA DS) 800-160 MG per tablet Take 1 tablet by mouth 2 (two) times daily. 01/10/14   Rayley Gao A Forucci, PA-C   BP 146/76  Pulse 76  Temp(Src) 98.9 F (37.2 C) (Oral)  Resp 20  SpO2 100% Physical Exam  Nursing note and vitals reviewed. Constitutional: He appears well-developed and well-nourished. No distress.  HENT:  Head: Normocephalic and atraumatic.  Mouth/Throat: Oropharynx is clear and moist. No oropharyngeal exudate.  Eyes: Conjunctivae are normal. No scleral icterus.  Neck: Normal range of motion. Neck supple.  Cardiovascular: Normal rate, regular rhythm, normal heart sounds and intact distal pulses.  Exam reveals no gallop and no friction rub.   No murmur heard. Pulmonary/Chest: Effort normal and breath sounds normal. No respiratory distress. He has no wheezes. He has no rales. He exhibits no tenderness.  Genitourinary: Rectum normal,  testes normal and penis normal.  Patient has a 3-4 cm x 3 cm abscess like area with palpable induration around the edges with some areas of fluctuance of the left buttocks.  There does not appear to be any rectal involvement or drainage at this time.  Area is not hot to the touch.  Painful to palpation.  Skin: He is  not diaphoretic.    ED Course  INCISION AND DRAINAGE Date/Time: 01/10/2014 4:40 PM Performed by: Madelyn FlavorsFORUCCI, Sabriel Borromeo A Authorized by: Madelyn FlavorsFORUCCI, Arley Salamone A Consent: Verbal consent obtained. Risks and benefits: risks, benefits and alternatives were discussed Consent given by: patient Patient understanding: patient states understanding of the procedure being performed Patient consent: the patient's understanding of the procedure matches consent given Procedure consent: procedure consent matches procedure scheduled Relevant documents: relevant documents present and verified Test results: test results available and properly labeled Site marked: the operative site was marked Imaging studies: imaging studies available Patient identity confirmed: verbally with patient Time out: Immediately prior to procedure a "time out" was called to verify the correct patient, procedure, equipment, support staff and site/side marked as required. Type: abscess Body area: anogenital Location details: perianal Anesthesia: local infiltration Local anesthetic: lidocaine 2% with epinephrine Anesthetic total: 4 ml Patient sedated: no Scalpel size: 10 Incision type: single straight Complexity: simple Drainage: purulent and  serosanguinous Drainage amount: moderate Wound treatment: wound left open Patient tolerance: Patient tolerated the procedure well with no immediate complications.   (including critical care time) Labs Review Labs Reviewed - No data to display  Imaging Review No results found.   EKG Interpretation None      MDM   Final diagnoses:  Perianal abscess  HIV (human immunodeficiency virus infection)   Patient presents to the ED with new onset of rectal abscess.  Abscess is perirectal in nature and the patient is immunocompromised.  I have asked Dr. Denton LankSteinl to also look at the abscess given the location prior to I&D.  I have performed and I&D here.  Given immunocompromised status I will  give the patient 10 days of Bactrim DS BID.  I have told the patient to use warm compresses and to also use warm sitz baths.  Patient was told to take his abx until they are gone. He states understanding with the above plan.  He was told the abscess site will continue to drain.  Patient was given strict return precautions of worsening pain in the area, fever, or any type of anaphylactic reaction to his antibiotics. He states understanding.       Clydie Braunourtney A Forucci, PA-C 01/10/14 1645

## 2014-01-10 NOTE — ED Notes (Signed)
Pt ambulatory with steady gait to void in BR.  

## 2014-01-10 NOTE — ED Notes (Signed)
Pt c/o of anal abscess. States that he got a cold that turned into a sinus infection and then into an abscess. Pain 8/10.

## 2020-03-30 ENCOUNTER — Ambulatory Visit (HOSPITAL_COMMUNITY)
Admission: EM | Admit: 2020-03-30 | Discharge: 2020-03-30 | Disposition: A | Discharge status: Home / Self Care | Payer: 59 | Attending: Family Medicine | Admitting: Family Medicine

## 2020-03-30 ENCOUNTER — Encounter (HOSPITAL_COMMUNITY): Payer: Self-pay

## 2020-03-30 ENCOUNTER — Other Ambulatory Visit: Payer: Self-pay

## 2020-03-30 DIAGNOSIS — L0291 Cutaneous abscess, unspecified: Secondary | ICD-10-CM | POA: Diagnosis not present

## 2020-03-30 MED ORDER — HYDROCODONE-ACETAMINOPHEN 5-325 MG PO TABS: 1.0000 | ORAL_TABLET | Freq: Four times a day (QID) | ORAL | 0 refills | Status: AC | PRN

## 2020-03-30 MED ORDER — AMOXICILLIN-POT CLAVULANATE 875-125 MG PO TABS: 1.0000 | ORAL_TABLET | Freq: Two times a day (BID) | ORAL | 0 refills | Status: AC

## 2020-03-30 NOTE — ED Triage Notes (Signed)
Patient presents to Urgent Care with complaints of rectal pain. 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 Now it is bothering him more than ever, increased the amount of valacyclovir he has been taking to try and address the discomfort, no improvement.

## 2020-03-30 NOTE — Discharge Instructions (Signed)
We drained some of the area here today.  I will do warm soaks to the area to promote more drainage. Hydrocodone for pain as needed Antibiotics as prescribed Follow up as needed for continued or worsening symptoms

## 2020-04-02 NOTE — ED Provider Notes (Signed)
[facility]    CSN: [id number] Arrival date & time: 03/30/20  1307      History   Chief Complaint Chief Complaint  Patient presents with  . Rectal Pain    HPI KAPIL PETROPOULOS is a 44 y.o. male.   Patient is a 44 year old male who presents today with rectal pain.  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  We will schedule for surgery for last week but due to improving symptoms decided to cancel surgery for at this time.  Now for the past few days he has had return of pain and swelling to the gluteal area.  Denies any specific drainage.  No fever, chills, nausea or vomiting.     History reviewed. No pertinent past medical history.  Patient Active Problem List   Diagnosis Date Noted  . 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 CHRONIC KIDNEY DISEASE UNSPECIFIED 11/26/2009  . COUGH 11/26/2009  . 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 FATIGUE 12/11/2008  . HYPERLIPIDEMIA 11/13/2008  . HIV INFECTION 10/26/2008  . ANXIETY DEPRESSION 10/26/2008  . ASTHMA 10/26/2008  . ALLERGY, ENVIRONMENTAL 10/26/2008    History reviewed. No pertinent surgical history.     Home Medications    Prior to Admission medications   Medication Sig Start Date End Date Taking? Authorizing Provider  amoxicillin-clavulanate (AUGMENTIN) 875-125 MG tablet Take 1 tablet by mouth every 12 (twelve) hours. 03/30/20   Dahlia Byes A, NP  cetirizine (ZYRTEC) 10 MG tablet Take 10 mg by mouth daily as needed for allergies.    [provider]  elvitegravir-cobicistat-emtricitabine-tenofovir (STRIBILD) 150-150-200-300 MG TABS tablet Take 1 tablet by mouth daily with breakfast.    [provider]  HYDROcodone-acetaminophen (NORCO/VICODIN) 5-325 MG tablet Take 1-2 tablets by mouth every 6 (six) hours as needed. 03/30/20   Dahlia Byes A, NP  sulfamethoxazole-trimethoprim (SEPTRA DS) 800-160 MG per tablet Take 1 tablet by mouth 2 (two) times daily. 01/10/14   Shirleen Schirmer, PA-C    valACYclovir (VALTREX) 500 MG tablet Take 500 mg by mouth daily as needed (outbreak).    [provider]    Family History Family History  Problem Relation Age of Onset  . Healthy Mother   . Healthy Father     Social History Social History   Tobacco Use  . Smoking status: Never Smoker  . Smokeless tobacco: Never Used  Substance Use Topics  . Alcohol use: No  . Drug use: Yes    Types: Marijuana     Allergies   Patient has no known allergies.   Review of Systems Review of Systems   Physical Exam Triage Vital Signs ED Triage Vitals  Enc Vitals Group     BP 03/30/20 1426 133/90     Pulse Rate 03/30/20 1426 (!) 58     Resp 03/30/20 1426 16     Temp 03/30/20 1426 98.5 F (36.9 C)     Temp Source 03/30/20 1426 Oral     SpO2 03/30/20 1426 100 %     Weight --      Height --      Head Circumference --      Peak Flow --      Pain Score 03/30/20 1424 9     Pain Loc --      Pain Edu? --      Excl. in GC? --    No data found.  Updated Vital Signs BP 133/90 (BP Location: Right Arm)   Pulse (!) 58  Temp 98.5 F (36.9 C) (Oral)   Resp 16   SpO2 100%   Visual Acuity Right Eye Distance:   Left Eye Distance:   Bilateral Distance:    Right Eye Near:   Left Eye Near:    Bilateral Near:     Physical Exam Vitals and nursing note reviewed.  Constitutional:      Appearance: Normal appearance.  HENT:     Head: Normocephalic and atraumatic.     Nose: Nose normal.  Eyes:     Conjunctiva/sclera: Conjunctivae normal.  Pulmonary:     Effort: Pulmonary effort is normal.  Genitourinary:      Comments: Fistula. Draining purulent discharge. TTP  Musculoskeletal:        General: Normal range of motion.     Cervical back: Normal range of motion.  Skin:    General: Skin is warm and dry.  Neurological:     Mental Status: He is alert.  Psychiatric:        Mood and Affect: Mood normal.      UC Treatments / Results  Labs (all labs ordered are  listed, but only abnormal results are displayed) Labs Reviewed - No data to display  EKG   Radiology No results found.  Procedures Procedures (including critical care time)  Medications Ordered in UC Medications - No data to display  Initial Impression / Assessment and Plan / UC Course  I have reviewed the triage vital signs and the nursing notes.  Pertinent labs & imaging results that were available during my care of the patient were reviewed by me and considered in my medical decision making (see chart for details).     Anal fistula Was draining very well here on exam Expressed moderate amount of purulent drainage.  Pt feeling slightly better.  Warm soaks, amoxicillin, and hydrocodone for pain as needed.  Follow up as needed for continued or worsening symptoms  Final Clinical Impressions(s) / UC Diagnoses   Final diagnoses:  Abscess     Discharge Instructions     We drained some of the area here today.  I will do warm soaks to the area to promote more drainage. Hydrocodone for pain as needed Antibiotics as prescribed Follow up as needed for continued or worsening symptoms     ED Prescriptions    Medication Sig Dispense Auth. Provider   amoxicillin-clavulanate (AUGMENTIN) 875-125 MG tablet Take 1 tablet by mouth every 12 (twelve) hours. 14 tablet Azarias Chiou A, NP   HYDROcodone-acetaminophen (NORCO/VICODIN) 5-325 MG tablet Take 1-2 tablets by mouth every 6 (six) hours as needed. 10 tablet Britiny Defrain A, NP     I have reviewed the PDMP during this encounter.   Janace Aris, NP 04/02/20 1325
